# Patient Record
Sex: Male | Born: 1965 | Race: White | Hispanic: No | Marital: Married | Smoking: Never smoker
Health system: Southern US, Community
[De-identification: ages and names within clinical notes are randomized; demographics above are authoritative.]

---

## 2017-10-04 ENCOUNTER — Emergency Department
Admission: EM | Admit: 2017-10-04 | Discharge: 2017-10-04 | Disposition: A | Discharge status: Home / Self Care | Payer: Self-pay | Attending: Student in an Organized Health Care Education/Training Program | Admitting: Student in an Organized Health Care Education/Training Program

## 2017-10-04 ENCOUNTER — Encounter: Payer: Self-pay | Admitting: Emergency Medicine

## 2017-10-04 ENCOUNTER — Emergency Department: Payer: Self-pay

## 2017-10-04 DIAGNOSIS — R112 Nausea with vomiting, unspecified: Secondary | ICD-10-CM | POA: Insufficient documentation

## 2017-10-04 DIAGNOSIS — N201 Calculus of ureter: Secondary | ICD-10-CM | POA: Insufficient documentation

## 2017-10-04 LAB — BASIC METABOLIC PANEL
ANION GAP: 11 (ref 5–15)
BUN: 13 mg/dL (ref 6–20)
CHLORIDE: 105 mmol/L (ref 101–111)
CO2: 24 mmol/L (ref 22–32)
CREATININE: 1.11 mg/dL (ref 0.61–1.24)
Calcium: 9.4 mg/dL (ref 8.9–10.3)
GFR calc non Af Amer: >60 mL/min (ref >60)
Glucose, Bld: 140 mg/dL — ABNORMAL HIGH (ref 65–99)
Potassium: 3.9 mmol/L (ref 3.5–5.1)
SODIUM: 140 mmol/L (ref 135–145)

## 2017-10-04 LAB — CBC
HCT: 44 % (ref 40.0–52.0)
HEMOGLOBIN: 15.2 g/dL (ref 13.0–18.0)
MCH: 30.1 pg (ref 26.0–34.0)
MCHC: 34.5 g/dL (ref 32.0–36.0)
MCV: 87.3 fL (ref 80.0–100.0)
PLATELETS: 238 10*3/uL (ref 150–440)
RBC: 5.04 MIL/uL (ref 4.40–5.90)
RDW: 13.1 % (ref 11.5–14.5)
WBC: 10 10*3/uL (ref 3.8–10.6)

## 2017-10-04 LAB — URINALYSIS, COMPLETE (UACMP) WITH MICROSCOPIC
BACTERIA UA: NONE SEEN
BILIRUBIN URINE: NEGATIVE
Glucose, UA: NEGATIVE mg/dL
Ketones, ur: 20 mg/dL — AB
LEUKOCYTES UA: NEGATIVE
Nitrite: NEGATIVE
PH: 8 (ref 5.0–8.0)
Protein, ur: NEGATIVE mg/dL
SPECIFIC GRAVITY, URINE: 1.014 (ref 1.005–1.030)
SQUAMOUS EPITHELIAL / LPF: NONE SEEN

## 2017-10-04 MED ORDER — HYDROCODONE-ACETAMINOPHEN 5-325 MG PO TABS: 1.0000 | ORAL_TABLET | ORAL | 0 refills | Status: AC | PRN

## 2017-10-04 MED ORDER — MORPHINE SULFATE (PF) 4 MG/ML IV SOLN
4.0000 mg | INTRAVENOUS | Status: DC | PRN
  Administered 2017-10-04: 4 mg via INTRAVENOUS
  Filled 2017-10-04: qty 1

## 2017-10-04 MED ORDER — HYDROCODONE-ACETAMINOPHEN 5-325 MG PO TABS
ORAL_TABLET | ORAL | Status: AC
  Administered 2017-10-04: 1 via ORAL
  Filled 2017-10-04: qty 1

## 2017-10-04 MED ORDER — FENTANYL CITRATE (PF) 100 MCG/2ML IJ SOLN
INTRAMUSCULAR | Status: AC
  Administered 2017-10-04: 50 ug via INTRAVENOUS
  Filled 2017-10-04: qty 2

## 2017-10-04 MED ORDER — ONDANSETRON HCL 4 MG/2ML IJ SOLN
INTRAMUSCULAR | Status: AC
  Administered 2017-10-04: 4 mg via INTRAVENOUS
  Filled 2017-10-04: qty 2

## 2017-10-04 MED ORDER — PROMETHAZINE HCL 25 MG/ML IJ SOLN
12.5000 mg | Freq: Four times a day (QID) | INTRAMUSCULAR | Status: DC | PRN
  Administered 2017-10-04: 12.5 mg via INTRAVENOUS
  Filled 2017-10-04: qty 1

## 2017-10-04 MED ORDER — FENTANYL CITRATE (PF) 100 MCG/2ML IJ SOLN
50.0000 ug | Freq: Once | INTRAMUSCULAR | Status: AC
  Administered 2017-10-04: 50 ug via INTRAVENOUS

## 2017-10-04 MED ORDER — KETOROLAC TROMETHAMINE 30 MG/ML IJ SOLN
15.0000 mg | Freq: Once | INTRAMUSCULAR | Status: AC
  Administered 2017-10-04: 15 mg via INTRAVENOUS
  Filled 2017-10-04: qty 1

## 2017-10-04 MED ORDER — PROMETHAZINE HCL 12.5 MG PO TABS: 12.5000 mg | ORAL_TABLET | Freq: Four times a day (QID) | ORAL | 0 refills | Status: AC | PRN

## 2017-10-04 MED ORDER — ONDANSETRON HCL 4 MG/2ML IJ SOLN
4.0000 mg | Freq: Once | INTRAMUSCULAR | Status: AC
  Administered 2017-10-04: 4 mg via INTRAVENOUS

## 2017-10-04 MED ORDER — HYDROCODONE-ACETAMINOPHEN 5-325 MG PO TABS
1.0000 | ORAL_TABLET | Freq: Once | ORAL | Status: AC
  Administered 2017-10-04: 1 via ORAL

## 2017-10-04 MED ORDER — NAPROXEN 500 MG PO TABS: 500.0000 mg | ORAL_TABLET | Freq: Two times a day (BID) | ORAL | 0 refills | Status: AC

## 2017-10-04 MED ORDER — FENTANYL CITRATE (PF) 100 MCG/2ML IJ SOLN
50.0000 ug | Freq: Once | INTRAMUSCULAR | Status: AC
  Administered 2017-10-04: 50 ug via INTRAVENOUS
  Filled 2017-10-04: qty 2

## 2017-10-04 MED ORDER — SODIUM CHLORIDE 0.9 % IV BOLUS (SEPSIS)
1000.0000 mL | Freq: Once | INTRAVENOUS | Status: AC
  Administered 2017-10-04: 1000 mL via INTRAVENOUS

## 2017-10-04 NOTE — ED Triage Notes (Signed)
Pt to ED from home with intense right flank pain that started 1hr PTA radiating to abd.  States hx of kidney stone.  Nausea and vomiting.  Denies urinary symptoms.

## 2017-10-04 NOTE — ED Provider Notes (Signed)
[facility] Emergency Department Provider Note    First MD Initiated Contact with Patient 10/04/17 1955     (approximate)  I have reviewed the triage vital signs and the nursing notes.   HISTORY  Chief Complaint Flank Pain    HPI Travis Harding is a 51 y.o. male presents with a chief complaint of severe right flank pain and right lower quadrant pain. States that it was sudden in onset associated with nausea and vomiting. No fevers. Denies any chest pain. Denies any pain radiating down to his groin. Has not noted any dysuria or hematuria. Denies any back pain.   History reviewed. No pertinent past medical history. History reviewed. No pertinent family history. History reviewed. No pertinent surgical history. There are no active problems to display for this patient.     Prior to Admission medications   Medication Sig Start Date End Date Taking? Authorizing Provider  HYDROcodone-acetaminophen (NORCO) 5-325 MG tablet Take 1 tablet by mouth every 4 (four) hours as needed for moderate pain. 10/04/17   Willy Eddy, MD  naproxen (NAPROSYN) 500 MG tablet Take 1 tablet (500 mg total) by mouth 2 (two) times daily with a meal. 10/04/17 10/04/18  Willy Eddy, MD  promethazine (PHENERGAN) 12.5 MG tablet Take 1 tablet (12.5 mg total) by mouth every 6 (six) hours as needed for nausea or vomiting. 10/04/17   Willy Eddy, MD    Allergies Patient has no known allergies.    Social History Social History  Substance Use Topics  . Smoking status: Never Smoker  . Smokeless tobacco: Current User    Types: Chew  . Alcohol use No    Review of Systems Patient denies headaches, rhinorrhea, blurry vision, numbness, shortness of breath, chest pain, edema, cough, abdominal pain, nausea, vomiting, diarrhea, dysuria, fevers, rashes or hallucinations unless otherwise stated above in HPI. ____________________________________________   PHYSICAL  EXAM:  VITAL SIGNS: Vitals:   10/04/17 1818 10/04/17 2229  BP: 114/70 131/76  Pulse: 74 73  Resp: (!) 24 16  SpO2: 100% 98%    Constitutional: Alert and oriented. Having difficult finding position of comfort in bed Eyes: Conjunctivae are normal.  Head: Atraumatic. Nose: No congestion/rhinnorhea. Mouth/Throat: Mucous membranes are moist.   Neck: No stridor. Painless ROM.  Cardiovascular: Normal rate, regular rhythm. Grossly normal heart sounds.  Good peripheral circulation. Respiratory: Normal respiratory effort.  No retractions. Lungs CTAB. Gastrointestinal: Soft and nontender. No distention. No abdominal bruits. + ritght CVA tenderness. Genitourinary: Musculoskeletal: No lower extremity tenderness nor edema.  No joint effusions. Neurologic:  Normal speech and language. No gross focal neurologic deficits are appreciated. No facial droop Skin:  Skin is warm, dry and intact. No rash noted. Psychiatric: Mood and affect are normal. Speech and behavior are normal.  ____________________________________________   LABS (all labs ordered are listed, but only abnormal results are displayed)  Results for orders placed or performed during the hospital encounter of 10/04/17 (from the past 24 hour(s))  Basic metabolic panel     Status: Abnormal   Collection Time: 10/04/17  6:19 PM  Result Value Ref Range   Sodium 140 135 - 145 mmol/L   Potassium 3.9 3.5 - 5.1 mmol/L   Chloride 105 101 - 111 mmol/L   CO2 24 22 - 32 mmol/L   Glucose, Bld 140 (H) 65 - 99 mg/dL   BUN 13 6 - 20 mg/dL   Creatinine, Ser 7.84 0.61 - 1.24 mg/dL   Calcium 9.4 8.9 - 69.6 mg/dL  GFR calc non Af Amer >60 >60 mL/min   GFR calc Af Amer >60 >60 mL/min   Anion gap 11 5 - 15  CBC     Status: None   Collection Time: 10/04/17  6:19 PM  Result Value Ref Range   WBC 10.0 3.8 - 10.6 K/uL   RBC 5.04 4.40 - 5.90 MIL/uL   Hemoglobin 15.2 13.0 - 18.0 g/dL   HCT 78.2 95.6 - 21.3 %   MCV 87.3 80.0 - 100.0 fL   MCH 30.1  26.0 - 34.0 pg   MCHC 34.5 32.0 - 36.0 g/dL   RDW 08.6 57.8 - 46.9 %   Platelets 238 150 - 440 K/uL  Urinalysis, Complete w Microscopic     Status: Abnormal   Collection Time: 10/04/17  8:02 PM  Result Value Ref Range   Color, Urine STRAW (A) YELLOW   APPearance CLEAR (A) CLEAR   Specific Gravity, Urine 1.014 1.005 - 1.030   pH 8.0 5.0 - 8.0   Glucose, UA NEGATIVE NEGATIVE mg/dL   Hgb urine dipstick MODERATE (A) NEGATIVE   Bilirubin Urine NEGATIVE NEGATIVE   Ketones, ur 20 (A) NEGATIVE mg/dL   Protein, ur NEGATIVE NEGATIVE mg/dL   Nitrite NEGATIVE NEGATIVE   Leukocytes, UA NEGATIVE NEGATIVE   RBC / HPF TOO NUMEROUS TO COUNT 0 - 5 RBC/hpf   WBC, UA 0-5 0 - 5 WBC/hpf   Bacteria, UA NONE SEEN NONE SEEN   Squamous Epithelial / LPF NONE SEEN NONE SEEN   Mucus PRESENT    ____________________________________________ ____________________________________________  RADIOLOGY  I personally reviewed all radiographic images ordered to evaluate for the above acute complaints and reviewed radiology reports and findings.  These findings were personally discussed with the patient.  Please see medical record for radiology report.  ____________________________________________   PROCEDURES  Procedure(s) performed:  Procedures    Critical Care performed: no ____________________________________________   INITIAL IMPRESSION / ASSESSMENT AND PLAN / ED COURSE  Pertinent labs & imaging results that were available during my care of the patient were reviewed by me and considered in my medical decision making (see chart for details).  DDX: stone, AAA, uti, pyelo, appy, colitis  Travis Harding is a 51 y.o. who presents to the ED with stones p/w acute right flank pain. No fevers, no systemic symptoms. - urinary symptoms. Denies trauma or injury. Afebrile in ED. Exam as above. Flank TTP, otherwise abdominal exam is benign. No peritoneal signs. Possible kidney stone, cystitis, or pyelonephritis.    UA with hematuria, no bacteria CT Stone with 3mm right distal UVJ stone with hydro Clinical picture is not consistent with appendicitis, diverticulitis, pancreatitis, cholecystitis, bowel perforation, aortic dissection, splenic injury or acute abdominal process at this time.   Clinical Course as of Oct 05 2239  Tue Oct 04, 2017  2153 patient reassessed and feels much improved. Repeat abdominal exam soft and benign.  [PR]    Clinical Course User Index [PR] Willy Eddy, MD    Pain improved, tolerating PO. Repeat ABD exam benign, will plan supportive treatment and early follow up for recheck.   ____________________________________________   FINAL CLINICAL IMPRESSION(S) / ED DIAGNOSES  Final diagnoses:  Ureterolithiasis      NEW MEDICATIONS STARTED DURING THIS VISIT:  Discharge Medication List as of 10/04/2017 10:19 PM    START taking these medications   Details  HYDROcodone-acetaminophen (NORCO) 5-325 MG tablet Take 1 tablet by mouth every 4 (four) hours as needed for moderate pain., Starting Tue 10/04/2017,  Print    naproxen (NAPROSYN) 500 MG tablet Take 1 tablet (500 mg total) by mouth 2 (two) times daily with a meal., Starting Tue 10/04/2017, Until Wed 10/04/2018, Print    promethazine (PHENERGAN) 12.5 MG tablet Take 1 tablet (12.5 mg total) by mouth every 6 (six) hours as needed for nausea or vomiting., Starting Tue 10/04/2017, Print         Note:  This document was prepared using Dragon voice recognition software and may include unintentional dictation errors.    Willy Eddy, MD 10/04/17 2240

## 2017-10-04 NOTE — ED Notes (Signed)
Pt unable to stay still long enough for temperature at this time.

## 2017-10-04 NOTE — ED Notes (Signed)
Patient transported to CT 

## 2018-07-13 IMAGING — CT CT RENAL STONE PROTOCOL
2 of 4 series · 14 of 46 positions shown, 16 images · non-contrast
Comparison: None.

CLINICAL DATA: Right flank pain

EXAM:
CT ABDOMEN AND PELVIS WITHOUT CONTRAST
TECHNIQUE: Multidetector CT imaging of the abdomen and pelvis was performed
following the standard protocol without oral or intravenous contrast
maternal administration.

[Series 2: stone full standard · axial · 0.71mm/px · z∈[-397,+3]mm · 11 of 97 slices shown, 13 images]
[im 9/97  soft-tissue]
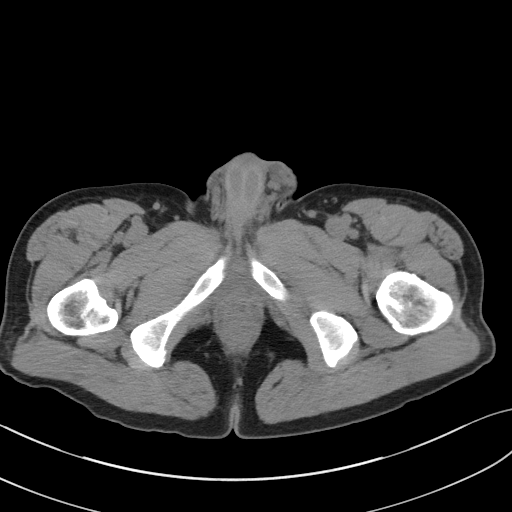
[im 9/97  bone]
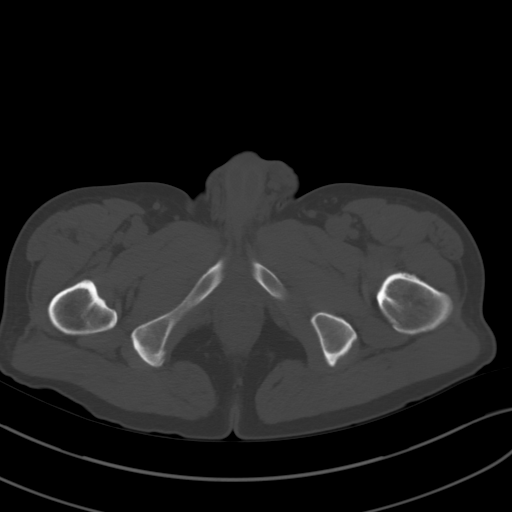
[im 17/97  soft-tissue]
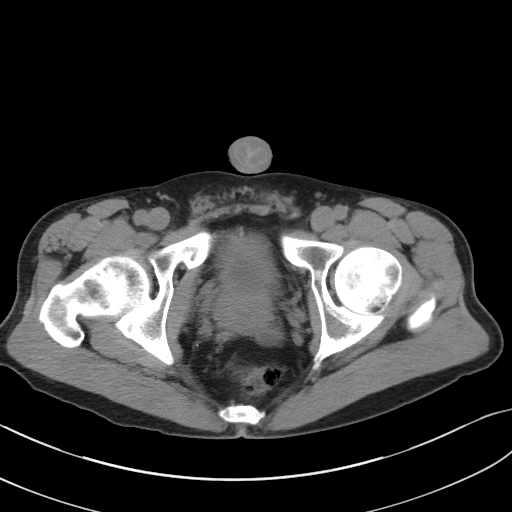
[im 25/97  soft-tissue]
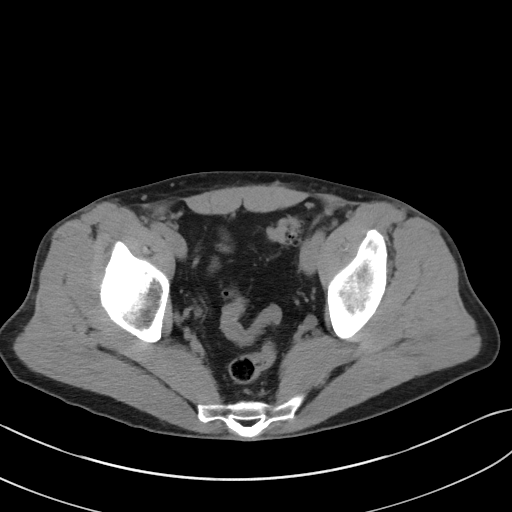
[im 33/97  soft-tissue]
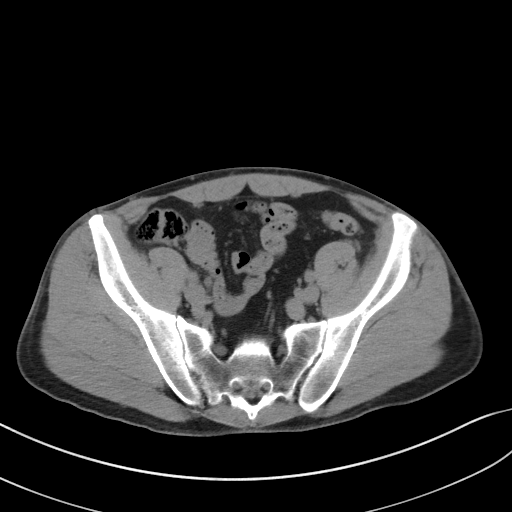
[im 41/97  soft-tissue]
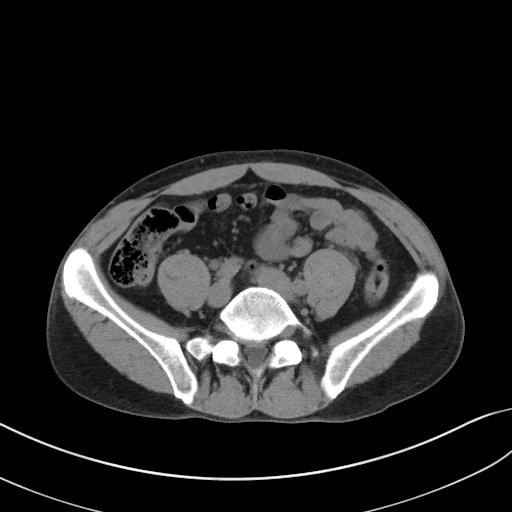
[im 49/97  soft-tissue]
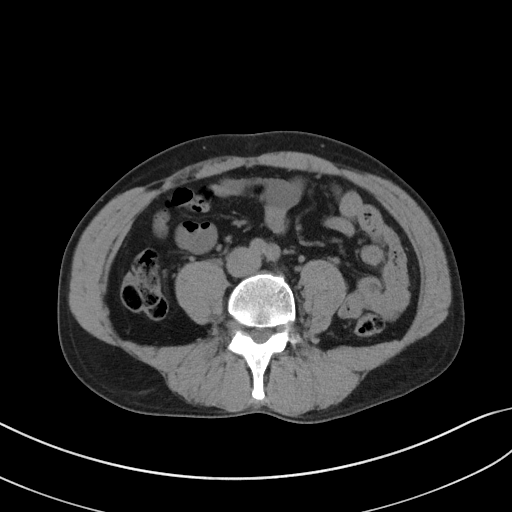
[im 57/97  soft-tissue]
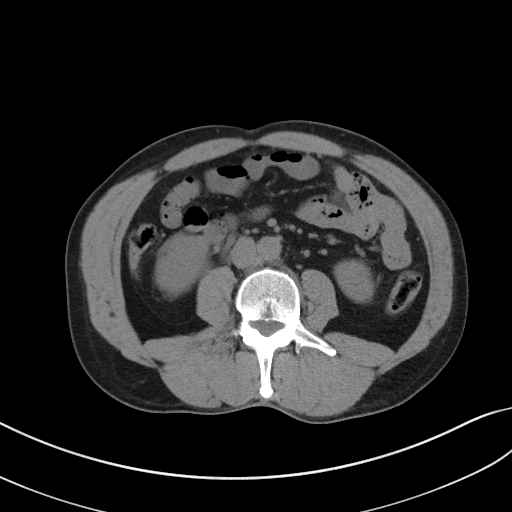
[im 65/97  soft-tissue]
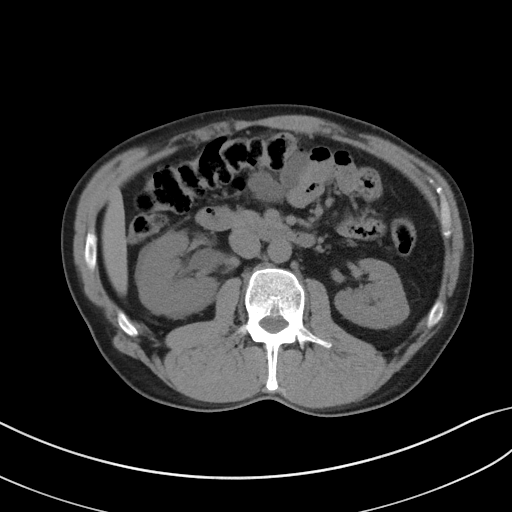
[im 73/97  soft-tissue]
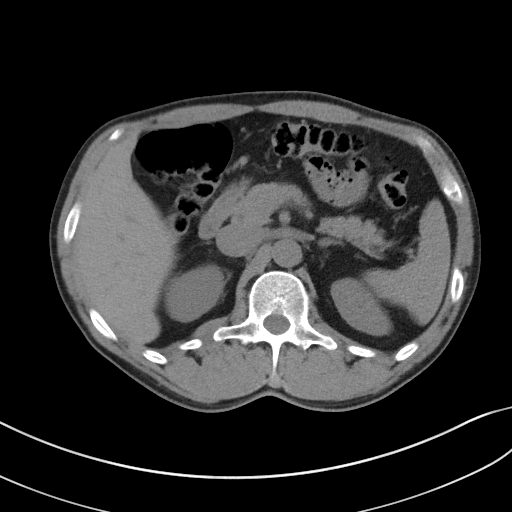
[im 73/97  bone]
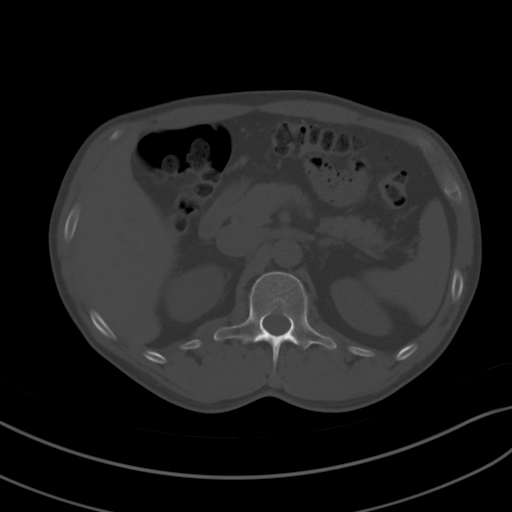
[im 81/97  soft-tissue]
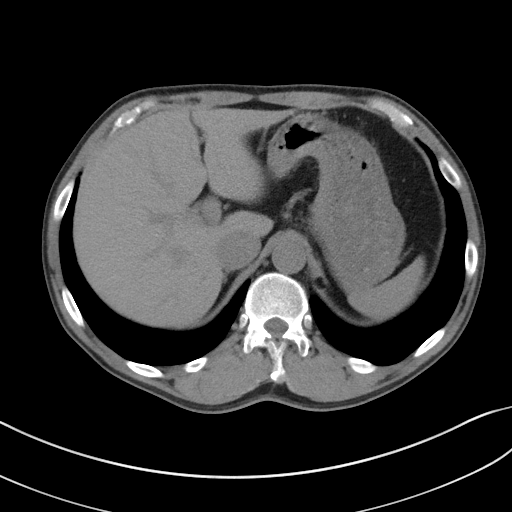
[im 89/97  soft-tissue]
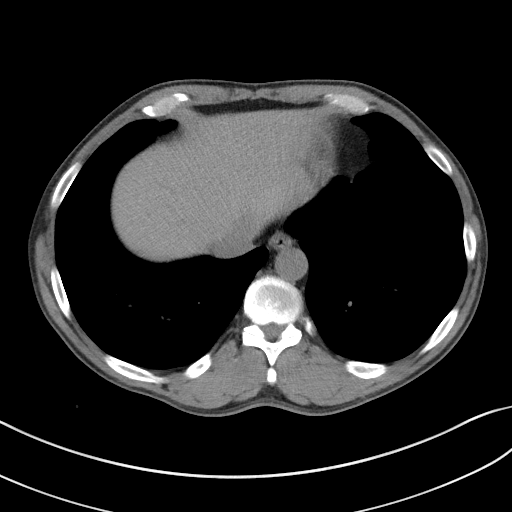

[Series 5: coronal · coronal · 0.62mm/px · 3 of 128 slices shown]
[im 43/128  soft-tissue]
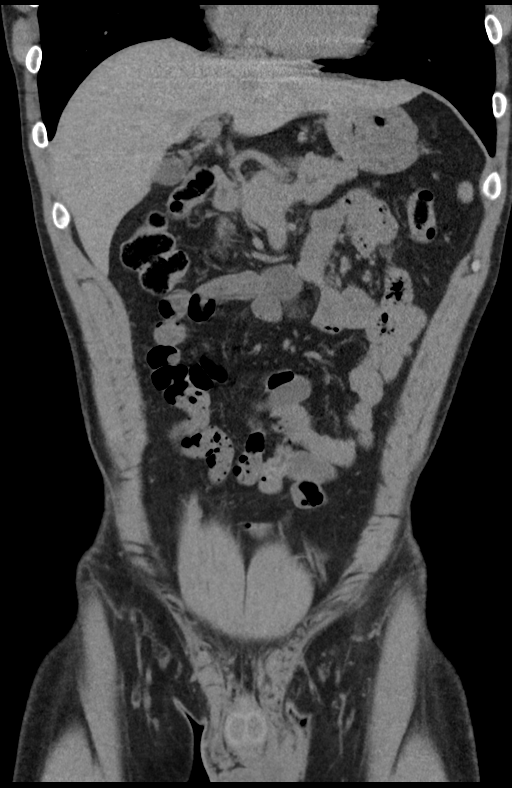
[im 57/128  soft-tissue]
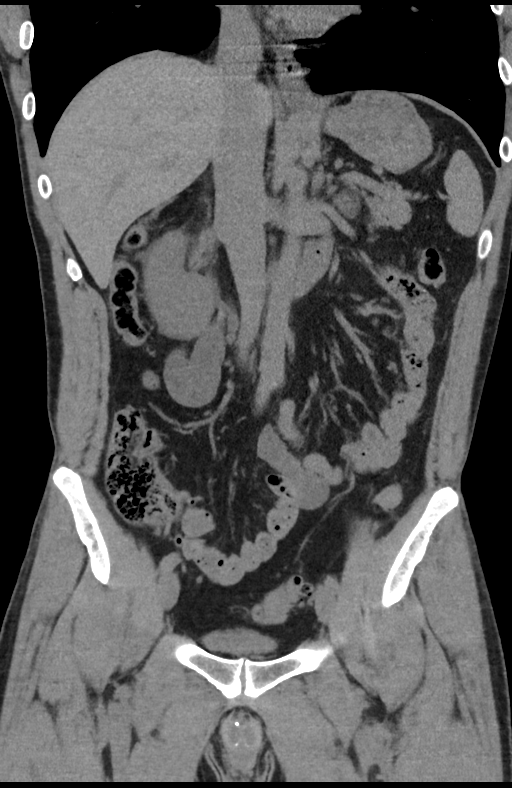
[im 71/128  soft-tissue]
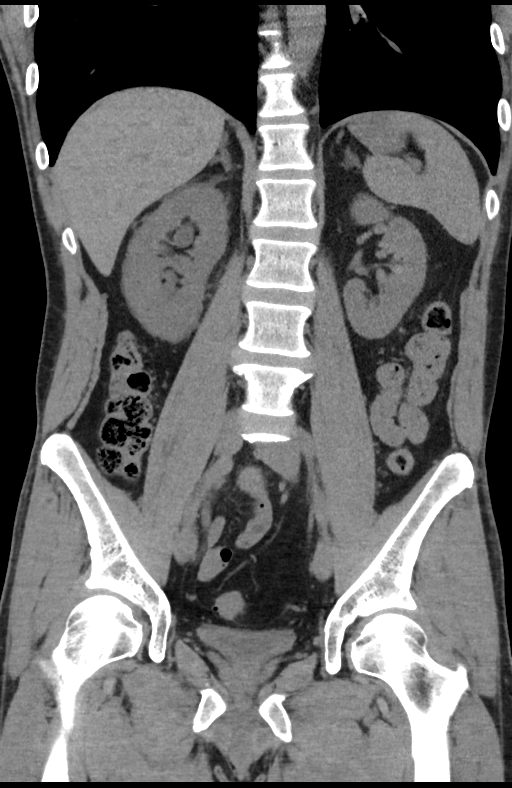

[14 of 46 positions shown; findings below may reference images not displayed]

FINDINGS: Lower chest: Lung bases are clear.

Hepatobiliary: No focal liver lesions are apparent on this
noncontrast enhanced study. Gallbladder wall is not appreciably
thickened. There is no biliary duct dilatation.

Pancreas: No pancreatic mass or inflammatory focus.

Spleen: No splenic lesions are evident. There is a tiny accessory
spleen medial to the spleen inferiorly.

Adrenals/Urinary Tract: Adrenals appear normal bilaterally. There is
no renal mass on either side. Right kidney is edematous with fairly
mild perinephric fluid on the right. There is moderate
hydronephrosis on the right. There is no hydronephrosis on the left.
There is a 2 mm calculus in the lower pole of the right kidney.
There is a 1 mm calculus in the upper pole left kidney with a second
1 mm calculus in the mid left kidney. On the right, there is a 3 mm
calculus in the distal right ureter at the mid acetabular level. No
other ureteral calculi are apparent on either side. Urinary bladder
wall is mildly thickened.

Stomach/Bowel: There are multiple sigmoid diverticula without
diverticulitis. There is no bowel wall or mesenteric thickening.
There is no evident bowel obstruction. No free air or portal venous
air.

Vascular/Lymphatic: There is atherosclerotic calcification in the
aorta. No aneurysm evident. Major mesenteric vessels appear patent
on this noncontrast enhanced study. No adenopathy is appreciable in
the abdomen or pelvis.

Reproductive: There are several small prostatic calculi. Prostate
and seminal vesicles appear normal in size and contour. No evident
pelvic mass.

Other: Appendix appears normal. No abscess or ascites is evident in
the abdomen or pelvis.

Musculoskeletal: There is degenerative change in the lower lumbar
spine. No blastic or lytic bone lesions are evident. There is no
intramuscular or abdominal wall lesion.
IMPRESSION: 1. 3 mm calculus distal right ureter at the mid acetabular level
with moderate hydronephrosis in ureterectasis on the right. Right
kidney is edematous with mild perinephric fluid on the right.

2. Small nonobstructing calculi in each kidney. Small prostatic
calculi present.

3.  Findings felt to represent a degree of cystitis.

4. Sigmoid diverticulosis without diverticulitis. No bowel
obstruction. No abscess. Appendix appears normal.

5.  Aortic atherosclerosis.

Aortic Atherosclerosis ([id number]).
